# Patient Record
Sex: Male | Born: 1975 | Race: Black or African American | Hispanic: No | Smoking: Current every day smoker
Health system: Southern US, Community
[De-identification: ages and names within clinical notes are randomized; demographics above are authoritative.]

## PROBLEM LIST (undated history)

## (undated) DIAGNOSIS — E7800 Pure hypercholesterolemia, unspecified: Secondary | ICD-10-CM

---

## 2008-03-31 ENCOUNTER — Ambulatory Visit: Payer: Self-pay | Admitting: Internal Medicine

## 2008-04-17 ENCOUNTER — Ambulatory Visit (HOSPITAL_COMMUNITY): Admission: RE | Admit: 2008-04-17 | Discharge: 2008-04-17 | Payer: Self-pay | Admitting: Internal Medicine

## 2008-04-17 ENCOUNTER — Encounter: Payer: Self-pay | Admitting: Internal Medicine

## 2008-04-17 ENCOUNTER — Ambulatory Visit: Payer: Self-pay | Admitting: Internal Medicine

## 2008-05-28 DIAGNOSIS — K5909 Other constipation: Secondary | ICD-10-CM

## 2008-05-28 DIAGNOSIS — K921 Melena: Secondary | ICD-10-CM

## 2008-05-28 DIAGNOSIS — R12 Heartburn: Secondary | ICD-10-CM

## 2008-05-28 DIAGNOSIS — F17200 Nicotine dependence, unspecified, uncomplicated: Secondary | ICD-10-CM | POA: Insufficient documentation

## 2008-05-28 DIAGNOSIS — F1010 Alcohol abuse, uncomplicated: Secondary | ICD-10-CM | POA: Insufficient documentation

## 2008-05-28 DIAGNOSIS — K219 Gastro-esophageal reflux disease without esophagitis: Secondary | ICD-10-CM | POA: Insufficient documentation

## 2008-05-29 ENCOUNTER — Ambulatory Visit: Payer: Self-pay | Admitting: Internal Medicine

## 2010-08-31 NOTE — Consult Note (Signed)
NAME:  Christopher Key, Christopher Key          ACCOUNT NO.:  [id number]   MEDICAL RECORD NO.:  [id number]          PATIENT TYPE:  AMB   LOCATION:  DAY                           FACILITY:  [facility]   PHYSICIAN:  Christopher Key, M.D. DATE OF BIRTH:  09-08-1975   DATE OF CONSULTATION:  03/31/2008  DATE OF DISCHARGE:                                 CONSULTATION   REASON FOR CONSULTATION:  Constipation, needs colonoscopy.   PHYSICIAN REQUESTING CONSULTATION:  Christopher A. Gerda Diss, MD   HISTORY OF PRESENT ILLNESS:  The patient is a very pleasant 35 year old  gentleman who presents today with complaints of chronic constipation.  He sent for colonoscopy.  The patient states he has constipation all of  his life.  He has tried various over-the-counter agents without  significant results.  Most recently, he was started on MiraLax 17 g  nightly.  He states it did not seem to help.  He has had to use Fleet  Enema or suppositories on demand.  He states generally he has a bowel  movement on most days, but he often has to strain quite a bit.  The  stools are hard.  He does have hematochezia at times.  He notes that he  eats plenty of vegetables in his diet, but his fluid intake consists  only of caffeinated products.  He has gained over 30 pounds since he has  been out of work for last 1 year.  He also has frequent heartburn.  He  takes Alka-Seltzer p.Christophern.  Denies any dysphagia, odynophagia, nausea,  vomiting, or melena.   CURRENT MEDICATIONS:  Tylenol p.Christophern., Advil p.Christophern., and Alka-Seltzer  p.Christophern. heartburn.   ALLERGIES:  No known drug allergies.   PAST MEDICAL HISTORY:  1. Chronic GERD.  2. Chronic constipation.   PAST SURGICAL HISTORY:  None.   FAMILY HISTORY:  Maternal grandmother died of colon cancer.  Mother has  a history of IBS.   SOCIAL HISTORY:  He is married with 3 children.  He has been out of work  for over a year, was laid off.  He quit smoking about a year ago.  He  consumes alcohol  rarely.   REVIEW OF SYSTEMS:  See HPI for GI.  CONSTITUTIONAL:  See HPI.  CARDIOPULMONARY:  No chest pain, shortness of breath, palpitations, or  cough.  GENITOURINARY:  No dysuria or hematuria.   PHYSICAL EXAMINATION:  VITAL SIGNS:  Weight 244, height 6 feet 2 inches,  temp 97.3, blood pressure 128/88, and pulse 80.  GENERAL:  Pleasant, well-nourished, well-developed black gentleman in no  acute distress.  SKIN:  Warm and dry.  No jaundice.  HEENT:  Sclerae nonicteric.  Oropharyngeal mucosa moist and pink.  No  lesions, erythema, or exudate.  No lymphadenopathy or thyromegaly.  CHEST:  Lungs are clear to auscultation.  CARDIOVASCULAR:  Regular rate and rhythm.  Normal S1 and S2.  No  murmurs, rubs, or gallops.  ABDOMEN:  Positive bowel sounds.  Abdomen is soft, slightly obese.  He  has mild left mid abdominal tenderness to deep palpation.  No rebound or  guarding.  No organomegaly or masses.  No abdominal bruits or hernia.  LOWER EXTREMITIES:  No edema.   IMPRESSION:  Christopher Key is a 35 year old gentleman with chronic  constipation with intermittent hematochezia, chronic gastroesophageal  reflux disease, and left-sided abdominal pain.  His abdominal pain may  be related to his constipation.  We will recommend colonoscopy to  further evaluate constipation and hematochezia.  Differential includes  benign anorectal source for bleeding, unlikely inflammatory bowel  disease, polyps, malignancy.  He has chronic uncomplicated  gastroesophageal reflux disease symptoms, would recommend proton pump  inhibitor therapy.   PLAN:  1. Colonoscopy with Dr. Jena Key in the near future.  2. Recommend Prilosec OTC one p.o. daily p.Christophern. heartburn.  If he has      frequent heartburn and having symptoms more than 2-3 days a week we      will recommend regular therapy.  We did not have any Prilosec      samples, so we gave him Nexium #20 to get started and he will      switch to Prilosec OTC or  Prevacid OTC for chronic therapy.  3. Further recommendations to follow.  4. I would like to thank Dr. Lilyan Key for allowing [place] to take part      in the care of this patient.      Christopher Key, P.AJonathon Key, M.D.  Electronically Signed    LL/MEDQ  D:  03/31/2008  T:  04/01/2008  Job:  [id number]   cc:   Christopher Picket A. Gerda Diss, MD  Fax: [phone]

## 2010-08-31 NOTE — Assessment & Plan Note (Signed)
NAMEMarland Key  HANIF, RADIN           CHART#:  [id number]   DATE:  05/29/2008                       DOB:  1975/10/23   SUBJECTIVE:  The patient is here for followup visit.  He underwent an  ileocolonoscopy with biopsy and lesion ablation on 04/17/2008.  Procedure was being done for chronic constipation and intermittent  hematochezia in the setting of Alka-Seltzer and Advil use on a regular  basis.  He had granularity, submucosal hemorrhage involving the rectal  mucosa.  The biopsies were nonspecific.  He had sigmoid polyps, which  were hyperplastic.  Terminal ileum was normal up to 10 cm.  He was given  a 2-week course of Canasa suppositories.  He told me that he was not  able to get the prescription refilled initially because he said he lost  the prescription, but he also had concerns about not being able to  afford them.  He did not try the Colace recommended either.  He states  he is feeling a lot better at this point.  He is currently having more  regular bowel movements.  His stools are sometimes still hard.  He has  had no further rectal bleeding.  He states he has not really had any  further left-sided abdominal pain.  He is concerned that if will have  recurrent symptoms as he tells me he does have episodes where he gets  severely constipated at times.  He denies any nausea, vomiting,  heartburn, dysphagia, odynophagia, or weight loss.  He tries to limit  his Advil and Alka-Seltzer use.   PHYSICAL EXAMINATION:  VITAL SIGNS:  Weight 241, temp 98.3, blood  pressure 128/80, and pulse 60.  GENERAL:  A pleasant, well-nourished, well-developed gentleman in no  acute distress.  SKIN:  Warm and dry.  No jaundice.  HEENT:  Sclerae nonicteric.  Oropharyngeal mucosa moist and pink.  ABDOMEN:  Positive bowel sounds.  Abdomen is soft, nontender, and  nondistended.  No organomegaly or masses.  No rebound or guarding.  No  abdominal bruits or hernias.  LOWER EXTREMITIES:  No edema.   IMPRESSION AND PLAN:  The patient is a 35 year old gentleman with  chronic constipation with intermittent hematochezia and recent abdominal  pain.  He is doing much better at this time.  His bowels are more  regular.  He did have some nonspecific proctitis at time of his  colonoscopy and some hyperplastic polyps, which were removed.  Unfortunately, he did not take his suppositories as recommended.  He is  not on stool softener at this time.  He has had no further bleeding.  We  discussed options today.  I suspect he will do fine without any sort of  treatment for his proctitis as long as we can continue to keep his bowel  movements.  Because of cost restraints, I have prescribed him Anusol-HC  Suppositories 1 per rectum b.i.d. for 2 weeks and he will take Colace  200 mg daily, but may decrease the dose if he develops diarrhea.  We  will  have him come back to see [place] on a p.r.n. basis only.  If he has any  further problems, he should let us know.  Otherwise, follow up with Dr.  Gerda Diss as needed.       Tana Coast, P.A.  Electronically Signed     R. Roetta Sessions, M.D.  Electronically Signed    LL/MEDQ  D:  05/29/2008  T:  05/29/2008  Job:  [id number]   cc:   Lorin Picket A. Gerda Diss, MD

## 2010-08-31 NOTE — Op Note (Signed)
NAME:  Christopher Key, Christopher Key          ACCOUNT NO.:  [id number]   MEDICAL RECORD NO.:  [id number]          PATIENT TYPE:  AMB   LOCATION:  DAY                           FACILITY:  [facility]   PHYSICIAN:  R. Roetta Sessions, M.D. DATE OF BIRTH:  Apr 16, 1976   DATE OF PROCEDURE:  04/17/2008  DATE OF DISCHARGE:                               OPERATIVE REPORT   PROCEDURE:  Ileocolonoscopy with biopsy, lesion ablation.   INDICATIONS FOR PROCEDURE:  A 35 year old gentleman with chronic  constipation and intermittent hematochezia, who takes Alka-Seltzer and  Advil on a regular basis.  Colonoscopy is now being done to further  evaluate his symptoms.  This approach has been discussed with the  patient at length.  Potential risks, benefits, alternatives, and  limitations have been reviewed, questions were answered.  He is  agreeable.  Please see the documentation in the medical record.   PROCEDURE NOTE:  O2 saturation, blood pressure, pulse, and respirations  were monitored throughout the entire procedure.   CONSCIOUS SEDATION:  Versed 4 mg IV and Demerol 100 mg IV in divided  doses.   INSTRUMENT:  Pentax video chip system.   FINDINGS:  Digital rectal exam revealed no abnormalities.  Endoscopic  findings:  The prep was adequate.  Colon:  Colonic mucosa was surveyed  from the rectosigmoid junction through the left transverse, right colon,  to the appendiceal orifice, ileocecal valve, and cecum.  These  structures were well seen and photographed for the record.  From this  level, the scope was slowly withdrawn.  All previously mentioned mucosal  surfaces were again seen.  The terminal ileum was intubated to 10 cm.  The patient was noted to have a couple of diminutive polyps in the mid  sigmoid.  They were cold biopsied/removed.  There are multiple other  tiny diminutive polyps at the rectosigmoid, which were ablated with a  tip of hot snare cautery unit.  The remainder of the colonic mucosa and  terminal ileal mucosa appeared normal.  The scope was pulled down the  rectum where thorough examination of the rectal mucosa including the  retroflexed view of the anal verge demonstrated some patchy granularity,  friability, submucosal hemorrhage, more than would be expected with  intermittent tip/scope trauma, suspicious for proctitis.  The rectal  mucosa was biopsied x2.  The patient tolerated the procedure well and  was reacted to Endoscopy.   IMPRESSION:  1. Granularity submucosal hemorrhage involving the rectal mucosa,      suspicious for proctitis status post biopsy, diminutive      rectosigmoid polyp status post hot snare tip ablation.  2. Diminutive sigmoid polyps status post cold biopsy removal.      Remaining colonic mucosa and terminal ileal mucosa appeared normal.   RECOMMENDATIONS:  1. Minimize the use of Advil and Alka-Seltzer for time being.  2. Followup on path.  3. A 2-week course of Canasa, 1 g mesalamine suppositories 1 per      rectum at bedtime.  4. Continue MiraLax p.r.n. constipation.  5. Add Colace 100 mg orally twice daily to his regimen to manage  constipation.  Further recommendations is to follow up in the near      future.      Jonathon Bellows, M.D.  Electronically Signed     RMR/MEDQ  D:  04/17/2008  T:  04/18/2008  Job:  [id number]   cc:   Lorin Picket A. Gerda Diss, MD  Fax: [phone]

## 2013-08-07 ENCOUNTER — Ambulatory Visit (INDEPENDENT_AMBULATORY_CARE_PROVIDER_SITE_OTHER): Payer: BC Managed Care – PPO | Admitting: Family Medicine

## 2013-08-07 VITALS — BP 130/88 | HR 63 | Temp 97.8°F | Ht 68.0 in | Wt 216.0 lb

## 2013-08-07 DIAGNOSIS — Z0000 Encounter for general adult medical examination without abnormal findings: Secondary | ICD-10-CM

## 2013-08-07 DIAGNOSIS — E785 Hyperlipidemia, unspecified: Secondary | ICD-10-CM

## 2013-08-07 LAB — POCT CBC
Granulocyte percent: 58 %G (ref 37–80)
HEMATOCRIT: 49.2 % (ref 43.5–53.7)
HEMOGLOBIN: 16.2 g/dL (ref 14.1–18.1)
LYMPH, POC: 2.7 (ref 0.6–3.4)
MCH: 29.8 pg (ref 27–31.2)
MCHC: 32.9 g/dL (ref 31.8–35.4)
MCV: 90.4 fL (ref 80–97)
MID (cbc): 0.6 (ref 0–0.9)
MPV: 9.4 fL (ref 0–99.8)
POC GRANULOCYTE: 4.5 (ref 2–6.9)
POC LYMPH PERCENT: 34.5 %L (ref 10–50)
POC MID %: 7.5 %M (ref 0–12)
Platelet Count, POC: 208 10*3/uL (ref 142–424)
RBC: 5.44 M/uL (ref 4.69–6.13)
RDW, POC: 15.1 %
WBC: 7.8 10*3/uL (ref 4.6–10.2)

## 2013-08-07 NOTE — Patient Instructions (Signed)
Strongly consider stopping smoking as discussed. Take a target date, then taper off the cigarettes to that date, with a goal of getting down to 6 or 8 cigarettes by you quit date. At that point quit all cigarettes and be miserable but don't pick up another cigarette ever. Plavix to yourself that you will not smoking even if major life crisis arises.  Return in one year or as needed

## 2013-08-07 NOTE — Progress Notes (Signed)
Physical examination:  History: 38 year old man who is here for a physical exam. He has no major acute complaints. He has been healthy.  Past medical history: Medical illnesses: None Surgical history: Unremarkable. He had some kind of a colonoscopy done once under anesthesia to check his prostate. I measure exactly when he had. Medications: None Allergies: None  Family history: Both parents are living and well, in their early 7s. 2 siblings are living and well. He has 3 children who live with him also living and well  Social history: Divorced, raises his 3 children. His parents live in the adjacent to down and help him out some. He does most of the work himself.Marland Kitchen He smokes one pack a day as he is done 15 years. 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 Rarely drinks. Does not use any drugs. High school education. Not sexually involved for 6 years.  Review of systems: Constitutional: Unremarkable HEENT: Unremarkable Eyes: Unremarkable Referred: Unremarkable Cardiovascular: Unremarkable Gastrointestinal: Unremarkable Endocrine: Unremarkable Genitourinary: Unremarkable Musculoskeletal: Unremarkable Allergy: Unremarkable Neurological: Unremarkable Hematologic: Unremarkable Psychiatric: Unremarkable   Physical examination: Well-developed well-nourished man in no acute distress. TMs are normal. Eyes PERRLA. Fundi benign. Throat clear. Neck supple without nodes or thyromegaly. No carotid bruits. Chest is clear to auscultation. Heart regular without murmurs gallops or arrhythmias. Abdomen has normal bowel sounds. Soft without organomegaly masses or tenderness. Normal male external genitalia with testes descended.tremities unremarkable. Skin normal except for some bruises.  Plan: CBC, C. met, lipids Return when necessary Long talk about smoking secession

## 2013-08-08 LAB — COMPREHENSIVE METABOLIC PANEL
ALT: 31 U/L (ref 0–53)
AST: 21 U/L (ref 0–37)
Albumin: 4.3 g/dL (ref 3.5–5.2)
Alkaline Phosphatase: 50 U/L (ref 39–117)
BILIRUBIN TOTAL: 0.9 mg/dL (ref 0.2–1.2)
BUN: 10 mg/dL (ref 6–23)
CO2: 25 meq/L (ref 19–32)
Calcium: 9.1 mg/dL (ref 8.4–10.5)
Chloride: 103 mEq/L (ref 96–112)
Creat: 0.93 mg/dL (ref 0.50–1.35)
GLUCOSE: 86 mg/dL (ref 70–99)
Potassium: 4 mEq/L (ref 3.5–5.3)
SODIUM: 138 meq/L (ref 135–145)
TOTAL PROTEIN: 7.4 g/dL (ref 6.0–8.3)

## 2013-08-08 LAB — LIPID PANEL
CHOLESTEROL: 247 mg/dL — AB (ref 0–200)
HDL: 36 mg/dL — ABNORMAL LOW (ref >39)
LDL Cholesterol: 180 mg/dL — ABNORMAL HIGH (ref 0–99)
TRIGLYCERIDES: 157 mg/dL — AB (ref <150)
Total CHOL/HDL Ratio: 6.9 Ratio
VLDL: 31 mg/dL (ref 0–40)

## 2013-08-14 MED ORDER — PRAVASTATIN SODIUM 20 MG PO TABS: 20.0000 mg | ORAL_TABLET | Freq: Every day | ORAL | Status: DC

## 2013-08-14 NOTE — Addendum Note (Signed)
Addended by: Honor LohGARRISON, Esterlene Atiyeh M on: 08/14/2013 05:03 PM   Modules accepted: Orders

## 2017-12-25 ENCOUNTER — Other Ambulatory Visit: Payer: Self-pay

## 2017-12-25 ENCOUNTER — Emergency Department (HOSPITAL_COMMUNITY)
Admission: EM | Admit: 2017-12-25 | Discharge: 2017-12-26 | Disposition: A | Discharge status: Home / Self Care | Payer: Self-pay | Attending: Emergency Medicine | Admitting: Emergency Medicine

## 2017-12-25 ENCOUNTER — Encounter (HOSPITAL_COMMUNITY): Payer: Self-pay

## 2017-12-25 DIAGNOSIS — Z79899 Other long term (current) drug therapy: Secondary | ICD-10-CM | POA: Insufficient documentation

## 2017-12-25 DIAGNOSIS — F17210 Nicotine dependence, cigarettes, uncomplicated: Secondary | ICD-10-CM | POA: Insufficient documentation

## 2017-12-25 DIAGNOSIS — R42 Dizziness and giddiness: Secondary | ICD-10-CM | POA: Insufficient documentation

## 2017-12-25 DIAGNOSIS — M79605 Pain in left leg: Secondary | ICD-10-CM | POA: Insufficient documentation

## 2017-12-25 HISTORY — DX: Pure hypercholesterolemia, unspecified: E78.00

## 2017-12-25 NOTE — ED Triage Notes (Signed)
Pt presents to ED for left leg pain and dizziness. Pt states his leg pain started this morning from lower back down to his foot. Pt denies any injury to his leg. Pt states he started getting dizzy this evening while at work. Pt denies SOB and chest pain.

## 2017-12-26 NOTE — Discharge Instructions (Addendum)
Your blood pressure is slightly elevated at 158/96.  Please have this rechecked soon.  The remainder of your vital signs are within normal limits.  Please use your pillow or donut when sitting over the next 3 days.  After the 3-day period, use this donut or pillow on an as-needed basis.  May use Tylenol every 4 hours or ibuprofen every 6 hours for pain or soreness.  Please see your primary physician or return to the emergency department if this leg pain continues.  You have reported that the dizziness has resolved after you had a Goody powder earlier during the day.  Please increase your fluids.  Please change positions slowly.  See your physician or return to the emergency department if the dizziness returns.

## 2017-12-26 NOTE — ED Provider Notes (Signed)
[facility] EMERGENCY DEPARTMENT Provider Note   CSN: [id number] Arrival date & time: 12/25/17  2240     History   Chief Complaint Chief Complaint  Patient presents with  . Leg Pain  . Dizziness    HPI Christopher Key is a 42 y.o. male.  Patient is a 42 year old male who presents to the emergency department with complaint of left leg pain and dizziness.  The patient states that the left leg pain started around 2:40 PM today.  Patient states he awoke and noted that he had a cramp type sensation in his left lower leg.  This was accompanied by a feeling of a heavy weight and also a sharp shooting pain going from the hip area down to the foot area.  The patient says he has not had any recent injury to his lower leg.  He has not had any recent operations procedures involving his leg or his back. Pt denies any excess swelling of the lower extremities. No long trips.   He has not had any problems with his back.  He states that he has had some increase activity at his job having to do more getting on and off of equipment and doing more walking and bending.  The patient also states that he does a lot of gaming.  He says that he has a gaming chair.  And that sometimes he is quite sore when he gets out of this hard chair from gaming for such long periods of time.  The patient describes his dizziness as a sensation of being off balance.  It started around 8 PM this afternoon known.  Said he felt at times like he might faint.  When he was on certain pieces of the equipment at his job he says he just did not feel steady or stable.  He went to sit down for little while.  He took BC powder.  And now he says that the dizziness has resolved.  During the time of the dizziness he did not have any double vision.  There was no palpitations.  No unusual headache to be reported.  The patient presents at this time for assistance with these issues.  The history is provided by the patient.  Leg Pain     Dizziness  Associated symptoms: no blood in stool, no chest pain, no palpitations and no shortness of breath     Past Medical History:  Diagnosis Date  . Hypercholesteremia     Patient Active Problem List   Diagnosis Date Noted  . ALCOHOL USE 05/28/2008  . SMOKER 05/28/2008  . GERD 05/28/2008  . CONSTIPATION, CHRONIC 05/28/2008  . HEMATOCHEZIA 05/28/2008  . HEARTBURN 05/28/2008    History reviewed. No pertinent surgical history.      Home Medications    Prior to Admission medications   Medication Sig Start Date End Date Taking? Authorizing Provider  pravastatin (PRAVACHOL) 20 MG tablet Take 1 tablet (20 mg total) by mouth daily. 08/14/13   Peyton Najjar, MD    Family History No family history on file.  Social History Social History   Tobacco Use  . Smoking status: Current Every Day Smoker    Packs/day: 1.00    Types: Cigarettes  . Smokeless tobacco: Never Used  Substance Use Topics  . Alcohol use: Yes    Alcohol/week: 2.0 standard drinks    Types: 1 Cans of beer, 1 Glasses of wine per week  . Drug use: Not on file  Allergies   Patient has no allergy information on record.   Review of Systems Review of Systems  Constitutional: Negative for activity change.       All ROS Neg except as noted in HPI  HENT: Negative for nosebleeds.   Eyes: Negative for photophobia and discharge.  Respiratory: Negative for cough, shortness of breath and wheezing.   Cardiovascular: Negative for chest pain and palpitations.  Gastrointestinal: Negative for abdominal pain and blood in stool.  Genitourinary: Negative for dysuria, frequency and hematuria.  Musculoskeletal: Negative for arthralgias, back pain and neck pain.       Leg pain  Skin: Negative.   Neurological: Positive for dizziness. Negative for seizures and speech difficulty.  Psychiatric/Behavioral: Negative for confusion and hallucinations.     Physical Exam Updated Vital Signs BP (!) 158/96 (BP  Location: Right Arm)   Pulse 87   Temp 98.2 F (36.8 C) (Oral)   Resp 20   Ht 6\' 1"  (1.854 m)   Wt 104.3 kg   SpO2 99%   BMI 30.34 kg/m   Physical Exam  Constitutional: He appears well-developed and well-nourished. No distress.  HENT:  Head: Normocephalic and atraumatic.  Right Ear: External ear normal.  Left Ear: External ear normal.  Eyes: Conjunctivae are normal. Right eye exhibits no discharge. Left eye exhibits no discharge. No scleral icterus.  Neck: Neck supple. No tracheal deviation present.  Cardiovascular: Normal rate, regular rhythm and intact distal pulses.  Pulmonary/Chest: Effort normal and breath sounds normal. No stridor. No respiratory distress. He has no wheezes. He has no rales.  Abdominal: Soft. Bowel sounds are normal. He exhibits no distension. There is no tenderness. There is no rebound and no guarding.  Musculoskeletal: Normal range of motion. He exhibits no edema or tenderness.       Lumbar back: Normal.       Left upper leg: Normal.  No swelling of the left lower extremity.  Negative Homans sign.  Neurological: He is alert. He has normal strength. No cranial nerve deficit (no facial droop, extraocular movements intact, no slurred speech) or sensory deficit. He exhibits normal muscle tone. He displays no seizure activity. Coordination normal.  Skin: Skin is warm and dry. No rash noted.  Psychiatric: He has a normal mood and affect.  Nursing note and vitals reviewed.    ED Treatments / Results  Labs (all labs ordered are listed, but only abnormal results are displayed) Labs Reviewed - No data to display  EKG EKG Interpretation  Date/Time:  Monday December 25 2017 22:57:31 EDT Ventricular Rate:  90 PR Interval:  136 QRS Duration: 96 QT Interval:  356 QTC Calculation: 435 R Axis:   5 Text Interpretation:  Normal sinus rhythm with sinus arrhythmia Moderate voltage criteria for LVH, may be normal variant Nonspecific T wave abnormality No old  tracing to compare Confirmed by Devoria Albe ([id number]) on 12/25/2017 11:04:13 PM   Radiology No results found.  Procedures Procedures (including critical care time)  Medications Ordered in ED Medications - No data to display   Initial Impression / Assessment and Plan / ED Course  I have reviewed the triage vital signs and the nursing notes.  Pertinent labs & imaging results that were available during my care of the patient were reviewed by me and considered in my medical decision making (see chart for details).       Final Clinical Impressions(s) / ED Diagnoses MDM  Blood pressure slightly elevated at 158/96, otherwise vital signs are within  normal limits.  Patient reports pain of the left leg.  He states however that he spends large amounts of time on a special gaming chair and sometimes when he gets off of it he is sore.  He also reports that he has been doing more activity at his job recently.  The pain has improved significantly after a BC powder.  Doubt the patient has a deep vein thrombus, as he is low risk by Wells criteria.  Also patient has not been in any excessively long trips, has not had any surgery or cancer recently.  Doubt deep tissue injury, as the patient has not had trauma or injury to this area.  I have asked the patient to purchase a pillow and to use a pillow on his gaming chair and also use a pillow when he is at rest to take some of the pressure off of his hip.  I suspect that he may have a contusion where the nerve plexus within the hip area.  The patient complained of some "dizziness" which he described as a sensation of being off balance and that he might faint.  This was not accompanied by any unusual headache.  No palpitations.  No chest pain.  No sweats.  It seemed to improve when he went to rest, and he states it was better after he took the [facility] powder.  His mother reports that he does not drink much water, but usually energy drinks and sodas.  Patient also was  noted to have some mild to moderate nasal congestion.  I have asked the patient to monitor the dizziness.  And to see his primary physician or return to the emergency department if this problem continues.  We did discuss increasing water in his system.  Patient is in agreement with this plan.   Final diagnoses:  Left leg pain  Dizziness    ED Discharge Orders    None       Ivery Quale, PA-C 12/26/17 0105    Devoria Albe, MD 12/26/17 [phone]

## 2019-04-06 ENCOUNTER — Emergency Department (HOSPITAL_COMMUNITY)
Admission: EM | Admit: 2019-04-06 | Discharge: 2019-04-06 | Disposition: A | Discharge status: Home / Self Care | Payer: Self-pay | Attending: Emergency Medicine | Admitting: Emergency Medicine

## 2019-04-06 ENCOUNTER — Encounter (HOSPITAL_COMMUNITY): Payer: Self-pay

## 2019-04-06 ENCOUNTER — Other Ambulatory Visit: Payer: Self-pay

## 2019-04-06 DIAGNOSIS — Y929 Unspecified place or not applicable: Secondary | ICD-10-CM | POA: Insufficient documentation

## 2019-04-06 DIAGNOSIS — Y999 Unspecified external cause status: Secondary | ICD-10-CM | POA: Insufficient documentation

## 2019-04-06 DIAGNOSIS — T161XXA Foreign body in right ear, initial encounter: Secondary | ICD-10-CM | POA: Insufficient documentation

## 2019-04-06 DIAGNOSIS — F17210 Nicotine dependence, cigarettes, uncomplicated: Secondary | ICD-10-CM | POA: Insufficient documentation

## 2019-04-06 DIAGNOSIS — Y939 Activity, unspecified: Secondary | ICD-10-CM | POA: Insufficient documentation

## 2019-04-06 DIAGNOSIS — X58XXXA Exposure to other specified factors, initial encounter: Secondary | ICD-10-CM | POA: Insufficient documentation

## 2019-04-06 MED ORDER — LIDOCAINE HCL (PF) 1 % IJ SOLN
5.0000 mL | Freq: Once | INTRAMUSCULAR | Status: AC
  Administered 2019-04-06: 20:00:00 5 mL
  Filled 2019-04-06: qty 6

## 2019-04-06 MED ORDER — ACETAMINOPHEN 500 MG PO TABS
1000.0000 mg | ORAL_TABLET | Freq: Once | ORAL | Status: AC
  Administered 2019-04-06: 21:00:00 1000 mg via ORAL
  Filled 2019-04-06: qty 2

## 2019-04-06 NOTE — ED Provider Notes (Signed)
[facility] EMERGENCY DEPARTMENT Provider Note   CSN: [id number] Arrival date & time: 04/06/19  1936     History Chief Complaint  Patient presents with  . Foreign Body in [place] is a 43 y.o. male.  Chief complaint foreign body in right ear just prior to ED visit while patient was taking a nap.  No other issues.        Past Medical History:  Diagnosis Date  . Hypercholesteremia     Patient Active Problem List   Diagnosis Date Noted  . ALCOHOL USE 05/28/2008  . SMOKER 05/28/2008  . GERD 05/28/2008  . CONSTIPATION, CHRONIC 05/28/2008  . HEMATOCHEZIA 05/28/2008  . HEARTBURN 05/28/2008    History reviewed. No pertinent surgical history.     No family history on file.  Social History   Tobacco Use  . Smoking status: Current Every Day Smoker    Packs/day: 1.00    Types: Cigarettes  . Smokeless tobacco: Never Used  Substance Use Topics  . Alcohol use: Yes    Alcohol/week: 2.0 standard drinks    Types: 1 Cans of beer, 1 Glasses of wine per week  . Drug use: Not on file    Home Medications Prior to Admission medications   Medication Sig Start Date End Date Taking? Authorizing Provider  pravastatin (PRAVACHOL) 20 MG tablet Take 1 tablet (20 mg total) by mouth daily. 08/14/13   Posey Boyer, MD    Allergies    Patient has no known allergies.  Review of Systems   Review of Systems  All other systems reviewed and are negative.   Physical Exam Updated Vital Signs BP (!) 141/102 (BP Location: Right Arm) Comment: pt appears anxious in triage  Pulse 61   Temp 97.8 F (36.6 C) (Temporal)   Resp 17   Ht 6\' 2"  (1.88 m)   Wt 95.3 kg   SpO2 96%   BMI 26.96 kg/m   Physical Exam Vitals and nursing note reviewed.  Constitutional:      Appearance: He is well-developed.  HENT:     Head: Normocephalic and atraumatic.     Comments: Right ear: Foreign body deep into external canal.  Appears to be a roach. Eyes:   Conjunctiva/sclera: Conjunctivae normal.  Cardiovascular:     Rate and Rhythm: Normal rate and regular rhythm.  Pulmonary:     Effort: Pulmonary effort is normal.     Breath sounds: Normal breath sounds.  Abdominal:     General: Bowel sounds are normal.     Palpations: Abdomen is soft.  Musculoskeletal:        General: Normal range of motion.     Cervical back: Neck supple.  Skin:    General: Skin is warm and dry.  Neurological:     General: No focal deficit present.     Mental Status: He is alert and oriented to person, place, and time.  Psychiatric:        Behavior: Behavior normal.     ED Results / Procedures / Treatments   Labs (all labs ordered are listed, but only abnormal results are displayed) Labs Reviewed - No data to display  EKG None  Radiology No results found.  Procedures .Foreign Body Removal  Date/Time: 04/06/2019 9:14 PM Performed by: Nat Christen, MD Authorized by: Nat Christen, MD  Consent: Verbal consent obtained. Risks and benefits: risks, benefits and alternatives were discussed Consent given by: patient Patient understanding: patient states understanding of the  procedure being performed Patient identity confirmed: verbally with patient Body area: ear Location details: right ear Anesthesia: local infiltration  Anesthesia: Local Anesthetic: lidocaine 1% without epinephrine  Sedation: Patient sedated: no  Localization method: ENT speculum Removal mechanism: alligator forceps Complexity: complex Post-procedure assessment: foreign body removed Comments: 2.0 cm roach removed from deep into external canal.   (including critical care time)  Medications Ordered in ED Medications  lidocaine (PF) (XYLOCAINE) 1 % injection 5 mL (5 mLs Infiltration Given 04/06/19 2023)  acetaminophen (TYLENOL) tablet 1,000 mg (1,000 mg Oral Given 04/06/19 2058)    ED Course  I have reviewed the triage vital signs and the nursing notes.  Pertinent labs &  imaging results that were available during my care of the patient were reviewed by me and considered in my medical decision making (see chart for details).    MDM Rules/Calculators/A&P                      Successful removal of roach from right ear external canal. Final Clinical Impression(s) / ED Diagnoses Final diagnoses:  Foreign body of right ear, initial encounter    Rx / DC Orders ED Discharge Orders    None       Donnetta Hutching, MD 04/06/19 2116

## 2019-04-06 NOTE — ED Notes (Signed)
Lidocaine instilled into R ear

## 2019-04-06 NOTE — ED Notes (Signed)
Visible FB to R ear

## 2019-04-06 NOTE — ED Triage Notes (Signed)
Pt awoke today and felt "something crawling in his ear". Pt attempted to remove with several different things, including q-tip, unable to removed. (right ear canal is red, unable to visualize TM, unsure what is blocking TM)

## 2019-04-06 NOTE — Discharge Instructions (Addendum)
Go to [facility] and get some drops for earwax.  This will help keep your ear drained.  Tylenol for discomfort.

## 2019-04-27 ENCOUNTER — Other Ambulatory Visit: Payer: Self-pay

## 2019-04-27 ENCOUNTER — Encounter (HOSPITAL_COMMUNITY): Payer: Self-pay | Admitting: *Deleted

## 2019-04-27 ENCOUNTER — Emergency Department (HOSPITAL_COMMUNITY)
Admission: EM | Admit: 2019-04-27 | Discharge: 2019-04-27 | Disposition: A | Discharge status: Home / Self Care | Payer: Self-pay | Attending: Emergency Medicine | Admitting: Emergency Medicine

## 2019-04-27 DIAGNOSIS — F17210 Nicotine dependence, cigarettes, uncomplicated: Secondary | ICD-10-CM | POA: Insufficient documentation

## 2019-04-27 DIAGNOSIS — H6091 Unspecified otitis externa, right ear: Secondary | ICD-10-CM | POA: Insufficient documentation

## 2019-04-27 DIAGNOSIS — Z79899 Other long term (current) drug therapy: Secondary | ICD-10-CM | POA: Insufficient documentation

## 2019-04-27 MED ORDER — NEOMYCIN-POLYMYXIN-HC 3.5-10000-1 OT SOLN: 4.0000 [drp] | Freq: Four times a day (QID) | OTIC | 0 refills | Status: AC

## 2019-04-27 NOTE — ED Notes (Signed)
Pt uses Qtips in ears   Reports doesn't know if he has a bug in his R ear or if something is in there   Here for evaluation

## 2019-04-27 NOTE — ED Triage Notes (Signed)
Pt with right ear pain for a day now.  Pt wants it checked for any FB. Hx of bug in the past

## 2019-04-27 NOTE — ED Provider Notes (Signed)
[facility] EMERGENCY DEPARTMENT Provider Note   CSN: [id number] Arrival date & time: 04/27/19  1304     History Chief Complaint  Patient presents with  . Otalgia    Christopher Key is a 44 y.o. male.  The history is provided by the patient. No language interpreter was used.  Otalgia Location:  Right Behind ear:  No abnormality Quality:  Aching Severity:  Moderate Onset quality:  Gradual Timing:  Constant Progression:  Worsening Chronicity:  New Relieved by:  Nothing Worsened by:  Nothing Ineffective treatments:  None tried Associated symptoms: no fever and no rhinorrhea   Pt reports he had a bug in his ear several months ago.  Pt worried he has something in his ear now.     Past Medical History:  Diagnosis Date  . Hypercholesteremia     Patient Active Problem List   Diagnosis Date Noted  . ALCOHOL USE 05/28/2008  . SMOKER 05/28/2008  . GERD 05/28/2008  . CONSTIPATION, CHRONIC 05/28/2008  . HEMATOCHEZIA 05/28/2008  . HEARTBURN 05/28/2008    History reviewed. No pertinent surgical history.     History reviewed. No pertinent family history.  Social History   Tobacco Use  . Smoking status: Current Every Day Smoker    Packs/day: 1.00    Types: Cigarettes  . Smokeless tobacco: Never Used  Substance Use Topics  . Alcohol use: Yes    Alcohol/week: 2.0 standard drinks    Types: 1 Cans of beer, 1 Glasses of wine per week  . Drug use: Not on file    Home Medications Prior to Admission medications   Medication Sig Start Date End Date Taking? Authorizing Provider  neomycin-polymyxin-hydrocortisone (CORTISPORIN) OTIC solution Place 4 drops into the right ear 4 (four) times daily for 10 days. 04/27/19 05/07/19  Fransico Meadow, PA-C  pravastatin (PRAVACHOL) 20 MG tablet Take 1 tablet (20 mg total) by mouth daily. 08/14/13   Posey Boyer, MD    Allergies    Patient has no known allergies.  Review of Systems   Review of Systems  Constitutional:  Negative for fever.  HENT: Positive for ear pain. Negative for rhinorrhea.   All other systems reviewed and are negative.   Physical Exam Updated Vital Signs BP 129/62 (BP Location: Right Arm)   Pulse 90   Temp 98.5 F (36.9 C) (Oral)   Resp 20   Ht 6\' 1"  (1.854 m)   Wt 95.3 kg   SpO2 98%   BMI 27.71 kg/m   Physical Exam Vitals and nursing note reviewed.  Constitutional:      Appearance: He is well-developed.  HENT:     Head: Normocephalic and atraumatic.     Left Ear: Tympanic membrane normal.     Ears:     Comments: Wax right canal. Canal erythematous,  Eyes:     Conjunctiva/sclera: Conjunctivae normal.  Cardiovascular:     Rate and Rhythm: Normal rate and regular rhythm.     Heart sounds: No murmur.  Pulmonary:     Effort: Pulmonary effort is normal. No respiratory distress.     Breath sounds: Normal breath sounds.  Abdominal:     Palpations: Abdomen is soft.     Tenderness: There is no abdominal tenderness.  Musculoskeletal:     Cervical back: Neck supple.  Skin:    General: Skin is warm and dry.  Neurological:     General: No focal deficit present.     Mental Status: He is alert.  Psychiatric:        Mood and Affect: Mood normal.     ED Results / Procedures / Treatments   Labs (all labs ordered are listed, but only abnormal results are displayed) Labs Reviewed - No data to display  EKG None  Radiology No results found.  Procedures .Ear Cerumen Removal  Date/Time: 04/27/2019 3:07 PM Performed by: Elson Areas, PA-C Authorized by: Elson Areas, PA-C   Consent:    Consent obtained:  Verbal   Consent given by:  Patient   Alternatives discussed:  No treatment Procedure details:    Location:  R ear   Procedure type: irrigation   Post-procedure details:    Patient tolerance of procedure:  Tolerated well, no immediate complications   (including critical care time)  Medications Ordered in ED Medications - No data to display  ED Course   I have reviewed the triage vital signs and the nursing notes.  Pertinent labs & imaging results that were available during my care of the patient were reviewed by me and considered in my medical decision making (see chart for details).    MDM Rules/Calculators/A&P                      MDM:  Final Clinical Impression(s) / ED Diagnoses Final diagnoses:  Otitis externa of right ear, unspecified chronicity, unspecified type    Rx / DC Orders ED Discharge Orders         Ordered    neomycin-polymyxin-hydrocortisone (CORTISPORIN) OTIC solution  4 times daily     04/27/19 1502        An After Visit Summary was printed and given to the patient.    Elson Areas, [place] 04/27/19 1507    Vanetta Mulders, MD 05/10/19 1520

## 2019-05-28 ENCOUNTER — Ambulatory Visit: Payer: Self-pay | Attending: Internal Medicine

## 2019-05-28 ENCOUNTER — Other Ambulatory Visit: Payer: Self-pay

## 2019-05-28 DIAGNOSIS — Z20822 Contact with and (suspected) exposure to covid-19: Secondary | ICD-10-CM

## 2019-05-29 LAB — NOVEL CORONAVIRUS, NAA: SARS-CoV-2, NAA: NOT DETECTED

## 2019-06-08 ENCOUNTER — Other Ambulatory Visit: Payer: Self-pay

## 2019-06-08 ENCOUNTER — Emergency Department (HOSPITAL_COMMUNITY)
Admission: EM | Admit: 2019-06-08 | Discharge: 2019-06-08 | Disposition: A | Discharge status: Home / Self Care | Payer: Self-pay | Attending: Emergency Medicine | Admitting: Emergency Medicine

## 2019-06-08 ENCOUNTER — Encounter (HOSPITAL_COMMUNITY): Payer: Self-pay | Admitting: Emergency Medicine

## 2019-06-08 DIAGNOSIS — Z79899 Other long term (current) drug therapy: Secondary | ICD-10-CM | POA: Insufficient documentation

## 2019-06-08 DIAGNOSIS — F17210 Nicotine dependence, cigarettes, uncomplicated: Secondary | ICD-10-CM | POA: Insufficient documentation

## 2019-06-08 DIAGNOSIS — K0889 Other specified disorders of teeth and supporting structures: Secondary | ICD-10-CM | POA: Insufficient documentation

## 2019-06-08 MED ORDER — DICLOFENAC SODIUM 50 MG PO TBEC: 50.0000 mg | DELAYED_RELEASE_TABLET | Freq: Two times a day (BID) | ORAL | 0 refills | Status: DC

## 2019-06-08 MED ORDER — AMOXICILLIN 500 MG PO CAPS: 500.0000 mg | ORAL_CAPSULE | Freq: Three times a day (TID) | ORAL | 0 refills | Status: DC

## 2019-06-08 NOTE — ED Notes (Signed)
ED Provider at bedside. 

## 2019-06-08 NOTE — ED Triage Notes (Signed)
Patient c/o right lower dental pain x3 days. Denies any fevers, abscess, or broken tooth that he is aware of. Patient taking BC powder, last dose prior to coming to ED with no relief

## 2019-06-08 NOTE — ED Provider Notes (Signed)
[facility] EMERGENCY DEPARTMENT Provider Note   CSN: [id number] Arrival date & time: 06/08/19  1141     History Chief Complaint  Patient presents with  . Dental Pain    Christopher Key is a 44 y.o. male.  The history is provided by the patient. No language interpreter was used.  Dental Pain Location:  Upper Upper teeth location:  1/RU 3rd molar Quality:  Aching Onset quality:  Gradual Timing:  Constant Progression:  Worsening Chronicity:  New Context: dental caries   Relieved by:  Nothing Worsened by:  Nothing Ineffective treatments:  None tried Associated symptoms: no fever        Past Medical History:  Diagnosis Date  . Hypercholesteremia     Patient Active Problem List   Diagnosis Date Noted  . ALCOHOL USE 05/28/2008  . SMOKER 05/28/2008  . GERD 05/28/2008  . CONSTIPATION, CHRONIC 05/28/2008  . HEMATOCHEZIA 05/28/2008  . HEARTBURN 05/28/2008    History reviewed. No pertinent surgical history.     No family history on file.  Social History   Tobacco Use  . Smoking status: Current Every Day Smoker    Packs/day: 1.00    Types: Cigarettes  . Smokeless tobacco: Never Used  Substance Use Topics  . Alcohol use: Not Currently  . Drug use: Never    Home Medications Prior to Admission medications   Medication Sig Start Date End Date Taking? Authorizing Provider  amoxicillin (AMOXIL) 500 MG capsule Take 1 capsule (500 mg total) by mouth 3 (three) times daily. 06/08/19   Fransico Meadow, PA-C  diclofenac (VOLTAREN) 50 MG EC tablet Take 1 tablet (50 mg total) by mouth 2 (two) times daily. 06/08/19   Fransico Meadow, PA-C  pravastatin (PRAVACHOL) 20 MG tablet Take 1 tablet (20 mg total) by mouth daily. 08/14/13   Posey Boyer, MD    Allergies    Patient has no known allergies.  Review of Systems   Review of Systems  Constitutional: Negative for fever.  All other systems reviewed and are negative.   Physical Exam Updated Vital Signs BP  (!) 134/94 (BP Location: Right Arm)   Pulse 63   Temp 98.4 F (36.9 C) (Oral)   Resp 16   Ht 6' (1.829 m)   Wt 95.3 kg   SpO2 100%   BMI 28.48 kg/m   Physical Exam Vitals and nursing note reviewed.  Constitutional:      Appearance: He is well-developed.  HENT:     Head: Normocephalic and atraumatic.     Nose: Nose normal.     Mouth/Throat:     Mouth: Mucous membranes are moist.  Eyes:     Conjunctiva/sclera: Conjunctivae normal.  Cardiovascular:     Rate and Rhythm: Normal rate and regular rhythm.     Heart sounds: No murmur.  Pulmonary:     Effort: Pulmonary effort is normal. No respiratory distress.     Breath sounds: Normal breath sounds.  Abdominal:     Palpations: Abdomen is soft.     Tenderness: There is no abdominal tenderness.  Musculoskeletal:        General: Normal range of motion.     Cervical back: Neck supple.  Skin:    General: Skin is warm and dry.  Neurological:     General: No focal deficit present.     Mental Status: He is alert.  Psychiatric:        Mood and Affect: Mood normal.  ED Results / Procedures / Treatments   Labs (all labs ordered are listed, but only abnormal results are displayed) Labs Reviewed - No data to display  EKG None  Radiology No results found.  Procedures Procedures (including critical care time)  Medications Ordered in ED Medications - No data to display  ED Course  I have reviewed the triage vital signs and the nursing notes.  Pertinent labs & imaging results that were available during my care of the patient were reviewed by me and considered in my medical decision making (see chart for details).    MDM Rules/Calculators/A&P                      MDM: Pt advised he needs to see a dentist for evaluation  Final Clinical Impression(s) / ED Diagnoses Final diagnoses:  Toothache    Rx / DC Orders ED Discharge Orders         Ordered    amoxicillin (AMOXIL) 500 MG capsule  3 times daily     06/08/19  1233    diclofenac (VOLTAREN) 50 MG EC tablet  2 times daily     06/08/19 1233        An After Visit Summary was printed and given to the patient.    Elson Areas, Cordelia Poche 06/08/19 1235    Bethann Berkshire, MD 06/09/19 [phone]

## 2019-07-01 ENCOUNTER — Other Ambulatory Visit: Payer: Self-pay

## 2019-07-01 ENCOUNTER — Emergency Department (HOSPITAL_COMMUNITY)
Admission: EM | Admit: 2019-07-01 | Discharge: 2019-07-01 | Disposition: A | Discharge status: Home / Self Care | Payer: Self-pay | Attending: Emergency Medicine | Admitting: Emergency Medicine

## 2019-07-01 ENCOUNTER — Emergency Department (HOSPITAL_COMMUNITY): Payer: Self-pay

## 2019-07-01 ENCOUNTER — Encounter (HOSPITAL_COMMUNITY): Payer: Self-pay

## 2019-07-01 DIAGNOSIS — R079 Chest pain, unspecified: Secondary | ICD-10-CM

## 2019-07-01 DIAGNOSIS — F17210 Nicotine dependence, cigarettes, uncomplicated: Secondary | ICD-10-CM | POA: Insufficient documentation

## 2019-07-01 DIAGNOSIS — R0789 Other chest pain: Secondary | ICD-10-CM | POA: Insufficient documentation

## 2019-07-01 LAB — BASIC METABOLIC PANEL
Anion gap: 7 (ref 5–15)
BUN: 11 mg/dL (ref 6–20)
CO2: 26 mmol/L (ref 22–32)
Calcium: 9 mg/dL (ref 8.9–10.3)
Chloride: 106 mmol/L (ref 98–111)
Creatinine, Ser: 1.08 mg/dL (ref 0.61–1.24)
GFR calc Af Amer: >60 mL/min (ref >60)
GFR calc non Af Amer: >60 mL/min (ref >60)
Glucose, Bld: 99 mg/dL (ref 70–99)
Potassium: 3.7 mmol/L (ref 3.5–5.1)
Sodium: 139 mmol/L (ref 135–145)

## 2019-07-01 LAB — CBC
HCT: 48.9 % (ref 39.0–52.0)
Hemoglobin: 16.2 g/dL (ref 13.0–17.0)
MCH: 29.7 pg (ref 26.0–34.0)
MCHC: 33.1 g/dL (ref 30.0–36.0)
MCV: 89.7 fL (ref 80.0–100.0)
Platelets: 223 10*3/uL (ref 150–400)
RBC: 5.45 MIL/uL (ref 4.22–5.81)
RDW: 13.6 % (ref 11.5–15.5)
WBC: 6.7 10*3/uL (ref 4.0–10.5)
nRBC: 0 % (ref 0.0–0.2)

## 2019-07-01 LAB — TROPONIN I (HIGH SENSITIVITY)
Troponin I (High Sensitivity): <2 ng/L (ref <18)
Troponin I (High Sensitivity): <2 ng/L (ref <18)

## 2019-07-01 NOTE — Discharge Instructions (Signed)
Your testing is normal, there is no signs of heart attack, please seek medical exam for severe or worsening symptoms, you may take Tylenol or ibuprofen for pain, follow-up with the family doctor to establish care.  You will likely need to have your blood pressure rechecked within a couple of weeks as it was slightly elevated today.  If you do not have a doctor see the below list  [facility] Doctor List    Kari Baars MD. Specialty: Pulmonary Disease Contact information: [address]  [address]  [place] [address]  [phone]   Syliva Overman, MD. Specialty: [facility] Medicine Contact information: [address]  [place] [address]  [phone]   Lilyan Punt, MD. Specialty: [facility] Medicine Contact information: [address]  [address]  [place] [address]  [phone]   Avon Gully, MD Specialty: Internal Medicine Contact information: [address] [place] [address]  [phone]   Catalina Pizza, MD. Specialty: Internal Medicine Contact information: [address]  [place] [address]  [phone]    [facility] (Dr. Selena Batten) Specialty: Family Medicine Contact information: [address]  [place] [address]  [phone]   John Giovanni, MD. Specialty: [facility] Medicine Contact information: [address]  [address]  [place] [address]  [phone]   Carylon Perches, MD. Specialty: Internal Medicine Contact information: [address]  [address]  [place] [address]  [phone]    [facility]  [address] [place], [place] [address] [phone]  Services [facility] offers a variety of basic health services.  Services include but are not limited to: Blood pressure checks  Heart rate checks  Blood sugar checks  Urine analysis  Rapid strep tests  Pregnancy tests.  Health education and referrals  People  needing more complex services will be directed to a physician online. Using these virtual visits, doctors can evaluate and prescribe medicine and treatments. There will be no medication on-site, though [facility] will help patients fill their prescriptions at little to no cost.   For More information please go to: [email or web address]

## 2019-07-01 NOTE — ED Provider Notes (Signed)
[facility] EMERGENCY DEPARTMENT Provider Note   CSN: [id number] Arrival date & time: 07/01/19  1337     History Chief Complaint  Patient presents with  . Chest Pain    Christopher Key is a 44 y.o. male.  HPI   This patient is a 44 year old male, he has a history of tobacco use, he denies having high cholesterol or hypertension and states he does not take any medications at all.  He reports that he no longer uses any alcohol.  The patient reports that over the last couple of hours he has had a intermittent right-sided chest pain that seems to come and go.  It seems to be worse when he moves or tries to lay on his right side or when he moves his right arm, it is not exertional otherwise and is not associated with shortness of breath fevers chills nausea vomiting or swelling of the legs.  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  He reports having significant anxiety recently and dealing with his 17 year old child who he wants to stay with him more but splits custody with mother.  He is currently having arguments with both his child's mother as well as his own mother and how to deal with this.  He has also been stressed because of the neighbors and occluding his driveway.  He appears mildly anxious and agitated during this discussion.  He denies any prior cardiac history  Past Medical History:  Diagnosis Date  . Hypercholesteremia     Patient Active Problem List   Diagnosis Date Noted  . ALCOHOL USE 05/28/2008  . SMOKER 05/28/2008  . GERD 05/28/2008  . CONSTIPATION, CHRONIC 05/28/2008  . HEMATOCHEZIA 05/28/2008  . HEARTBURN 05/28/2008    History reviewed. No pertinent surgical history.     No family history on file.  Social History   Tobacco Use  . Smoking status: Current Every Day Smoker    Packs/day: 1.00    Types: Cigarettes  . Smokeless tobacco: Never Used  Substance Use  Topics  . Alcohol use: Not Currently  . Drug use: Never    Home Medications Prior to Admission medications   Not on File    Allergies    Patient has no known allergies.  Review of Systems   Review of Systems  All other systems reviewed and are negative.   Physical Exam Updated Vital Signs BP (!) 142/86 (BP Location: Left Arm)   Pulse (!) 59   Temp 97.8 F (36.6 C) (Oral)   Resp 12   Ht 1.854 m (6\' 1" )   Wt 95.3 kg   SpO2 100%   BMI 27.71 kg/m   Physical Exam Vitals and nursing note reviewed.  Constitutional:      General: He is not in acute distress.    Appearance: He is well-developed.  HENT:     Head: Normocephalic and atraumatic.     Mouth/Throat:     Pharynx: No oropharyngeal exudate.  Eyes:     General: No scleral icterus.       Right eye: No discharge.        Left eye: No discharge.     Conjunctiva/sclera: Conjunctivae normal.     Pupils: Pupils are equal, round, and reactive to light.  Neck:     Thyroid: No thyromegaly.     Vascular: No JVD.  Cardiovascular:     Rate and Rhythm: Normal  rate and regular rhythm.     Heart sounds: Normal heart sounds. No murmur. No friction rub. No gallop.   Pulmonary:     Effort: Pulmonary effort is normal. No respiratory distress.     Breath sounds: Normal breath sounds. No wheezing or rales.  Abdominal:     General: Bowel sounds are normal. There is no distension.     Palpations: Abdomen is soft. There is no mass.     Tenderness: There is no abdominal tenderness.  Musculoskeletal:        General: No tenderness. Normal range of motion.     Cervical back: Normal range of motion and neck supple.  Lymphadenopathy:     Cervical: No cervical adenopathy.  Skin:    General: Skin is warm and dry.     Findings: No erythema or rash.  Neurological:     Mental Status: He is alert.     Coordination: Coordination normal.  Psychiatric:        Behavior: Behavior normal.     ED Results / Procedures / Treatments    Labs (all labs ordered are listed, but only abnormal results are displayed) Labs Reviewed  BASIC METABOLIC PANEL  CBC  TROPONIN I (HIGH SENSITIVITY)  TROPONIN I (HIGH SENSITIVITY)    EKG EKG Interpretation  Date/Time:  Monday July 01 2019 13:43:16 EDT Ventricular Rate:  63 PR Interval:  142 QRS Duration: 98 QT Interval:  396 QTC Calculation: 405 R Axis:   61 Text Interpretation: Normal sinus rhythm Normal ECG Since last tracing rate slower Confirmed by Eber Hong ([id number]) on 07/01/2019 1:57:21 PM   Radiology DG Chest 2 View  Result Date: 07/01/2019 CLINICAL DATA:  Chest pain EXAM: CHEST - 2 VIEW COMPARISON:  None. FINDINGS: The heart size and mediastinal contours are within normal limits. Both lungs are clear. No pleural effusion or pneumothorax. The visualized skeletal structures are unremarkable. IMPRESSION: No acute process in the chest Electronically Signed   By: Guadlupe Spanish M.D.   On: 07/01/2019 14:30    Procedures Procedures (including critical care time)  Medications Ordered in ED Medications - No data to display  ED Course  I have reviewed the triage vital signs and the nursing notes.  Pertinent labs & imaging results that were available during my care of the patient were reviewed by me and considered in my medical decision making (see chart for details).  Clinical Course as of Jul 01 1551  Mon Jul 01, 2019  1552 Labs are totally normal, troponin is totally normal and the chest x-ray is unremarkable   [BM]  1552 This patient is stable for discharge, likely noncardiac chest pain   [BM]    Clinical Course User Index [BM] Eber Hong, MD   MDM Rules/Calculators/A&P                      This patient has some intermittent right-sided chest pain that seems to be related more to movement.  He does not have any significant cardiac risk factors other than tobacco use.  He has no significant family members that died of an early age of cardiac disease, I will  order a troponin and a chest x-ray, his EKG is very reassuring and totally normal.  The patient is agreeable to the plan  Final Clinical Impression(s) / ED Diagnoses Final diagnoses:  Right-sided chest pain    Rx / DC Orders ED Discharge Orders    None  Noemi Chapel, MD 07/01/19 [phone]

## 2019-07-01 NOTE — ED Triage Notes (Signed)
Pt presents to ED with complaints of right sided chest pain started this am. Pt states feels like indigestion but also hurts to lift arms. Pt denies SOB, dizziness, N/V

## 2019-11-17 ENCOUNTER — Other Ambulatory Visit: Payer: Self-pay

## 2019-11-17 ENCOUNTER — Emergency Department (HOSPITAL_COMMUNITY)
Admission: EM | Admit: 2019-11-17 | Discharge: 2019-11-17 | Disposition: A | Discharge status: Home / Self Care | Payer: Self-pay

## 2020-05-05 ENCOUNTER — Other Ambulatory Visit: Payer: Self-pay

## 2020-05-08 ENCOUNTER — Other Ambulatory Visit: Payer: Self-pay

## 2021-01-13 ENCOUNTER — Encounter (HOSPITAL_COMMUNITY): Payer: Self-pay | Admitting: Radiology

## 2021-01-13 ENCOUNTER — Other Ambulatory Visit: Payer: Self-pay

## 2021-01-13 ENCOUNTER — Emergency Department (HOSPITAL_COMMUNITY)
Admission: EM | Admit: 2021-01-13 | Discharge: 2021-01-13 | Disposition: A | Discharge status: Home / Self Care | Payer: Self-pay | Attending: Emergency Medicine | Admitting: Emergency Medicine

## 2021-01-13 ENCOUNTER — Emergency Department (HOSPITAL_COMMUNITY): Payer: Self-pay

## 2021-01-13 DIAGNOSIS — Z0000 Encounter for general adult medical examination without abnormal findings: Secondary | ICD-10-CM | POA: Insufficient documentation

## 2021-01-13 DIAGNOSIS — F17210 Nicotine dependence, cigarettes, uncomplicated: Secondary | ICD-10-CM | POA: Insufficient documentation

## 2021-01-13 NOTE — ED Provider Notes (Signed)
[facility] EMERGENCY DEPARTMENT Provider Note   CSN: [id number] Arrival date & time: 01/13/21  1651     History Chief Complaint  Patient presents with   knot on left rib    Christopher Key is a 45 y.o. male.  Patient reports noticing a "knot" on his left lowest rib today. Found incidentally. No pain. No known injury. Denies cough or shortness of breath. No fever or chills. No significant past medical history. No current medications.  The history is provided by the patient. No language interpreter was used.      Past Medical History:  Diagnosis Date   Hypercholesteremia     Patient Active Problem List   Diagnosis Date Noted   ALCOHOL USE 05/28/2008   SMOKER 05/28/2008   GERD 05/28/2008   CONSTIPATION, CHRONIC 05/28/2008   HEMATOCHEZIA 05/28/2008   HEARTBURN 05/28/2008    History reviewed. No pertinent surgical history.     History reviewed. No pertinent family history.  Social History   Tobacco Use   Smoking status: Every Day    Packs/day: 1.00    Types: Cigarettes   Smokeless tobacco: Never  Vaping Use   Vaping Use: Never used  Substance Use Topics   Alcohol use: Not Currently   Drug use: Never    Home Medications Prior to Admission medications   Not on File    Allergies    Patient has no known allergies.  Review of Systems   Review of Systems  Constitutional:  Negative for fever.  Respiratory:  Negative for cough.   Cardiovascular:  Negative for chest pain.  Skin:  Negative for rash.  All other systems reviewed and are negative.  Physical Exam Updated Vital Signs BP 131/78 (BP Location: Left Arm)   Pulse (!) 59   Temp (!) 97.2 F (36.2 C) (Oral)   Resp 16   Ht 6\' 1"  (1.854 m)   Wt 95.3 kg   SpO2 100%   BMI 27.71 kg/m   Physical Exam Constitutional:      Appearance: Normal appearance.  HENT:     Head: Normocephalic.     Nose: Nose normal.  Eyes:     Conjunctiva/sclera: Conjunctivae normal.  Cardiovascular:     Rate  and Rhythm: Normal rate and regular rhythm.  Pulmonary:     Effort: Pulmonary effort is normal.     Breath sounds: Normal breath sounds.  Chest:     Chest wall: No tenderness.  Abdominal:     Palpations: Abdomen is soft.  Musculoskeletal:        General: Normal range of motion.     Cervical back: Normal range of motion.  Skin:    General: Skin is warm and dry.  Neurological:     Mental Status: He is alert and oriented to person, place, and time.  Psychiatric:        Mood and Affect: Mood normal.        Behavior: Behavior normal.    ED Results / Procedures / Treatments   Labs (all labs ordered are listed, but only abnormal results are displayed) Labs Reviewed - No data to display  EKG None  Radiology No results found.  Procedures Procedures   Medications Ordered in ED Medications - No data to display  ED Course  I have reviewed the triage vital signs and the nursing notes.  Pertinent labs & imaging results that were available during my care of the patient were reviewed by me and considered in my medical  decision making (see chart for details).    MDM Rules/Calculators/A&P                           Patient without acute findings on exam or radiology studies. Results shared with patient. Patient safe for discharge at this time. Encourage establishment of primary care provider. Final Clinical Impression(s) / ED Diagnoses Final diagnoses:  Normal exam    Rx / DC Orders ED Discharge Orders     None        Felicie Morn, NP 01/13/21 2311    Pollyann Savoy, MD 01/15/21 [id number]

## 2021-01-13 NOTE — Discharge Instructions (Addendum)
Your chest xray does not reveal any abnormality in the left lower ribs. You do have an old rib fracture of the 8th rib that appears chronic. Please follow up with a primary care provider as discussed.

## 2023-03-18 IMAGING — DX DG RIBS W/ CHEST 3+V*L*
5 series · 5 of 5 positions shown · non-contrast
Comparison: None.

CLINICAL DATA: Feels a knot along the left anterior chest. Noted
today. No known injury. Painless.

EXAM:
LEFT RIBS AND CHEST - 3+ VIEW

[chest pa]
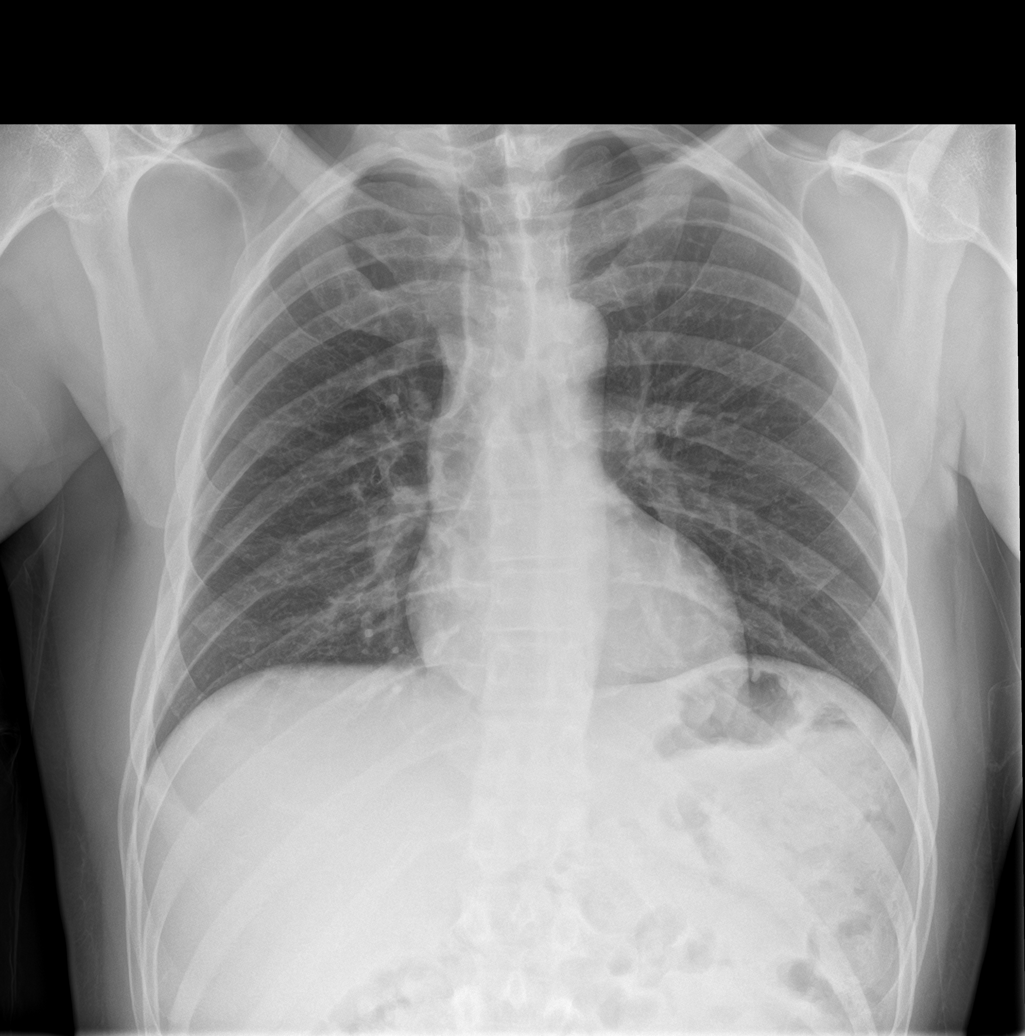

[rib pa obl (1 of 2)]
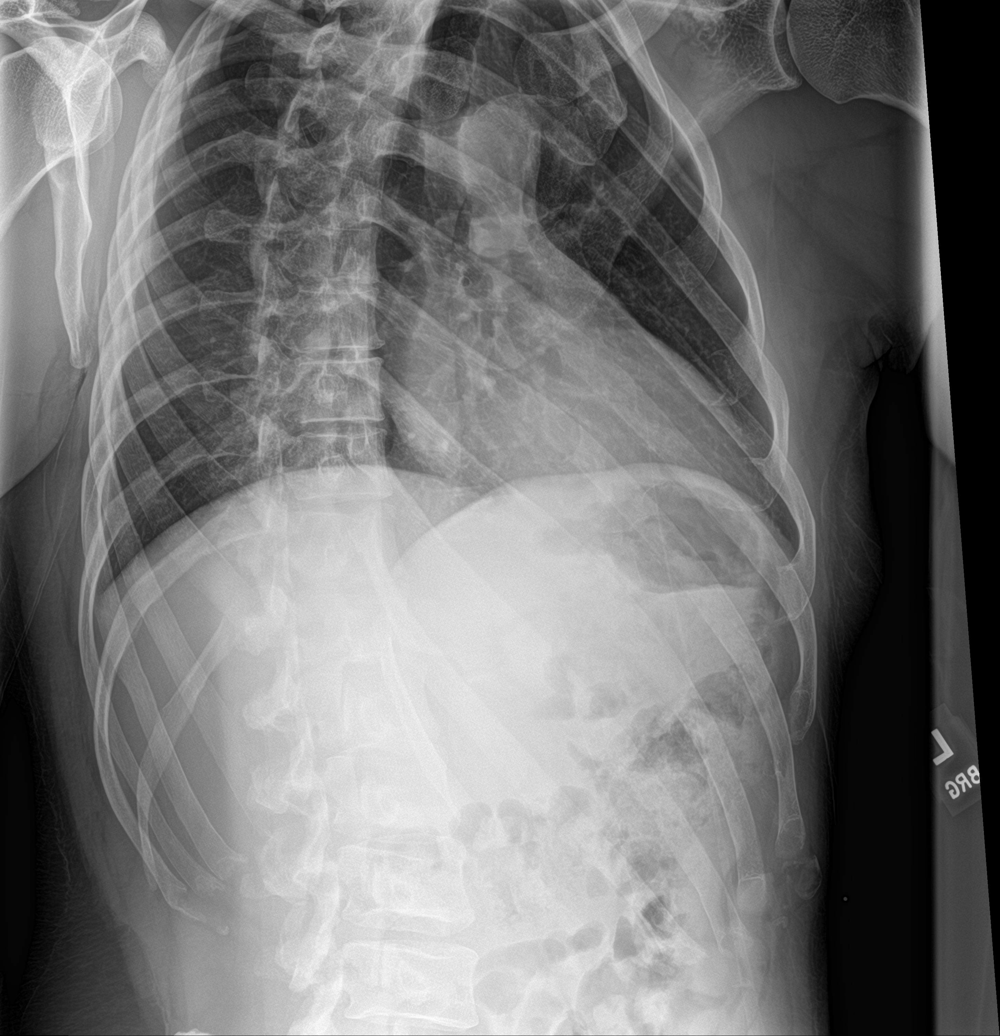

[rib pa obl (2 of 2)]
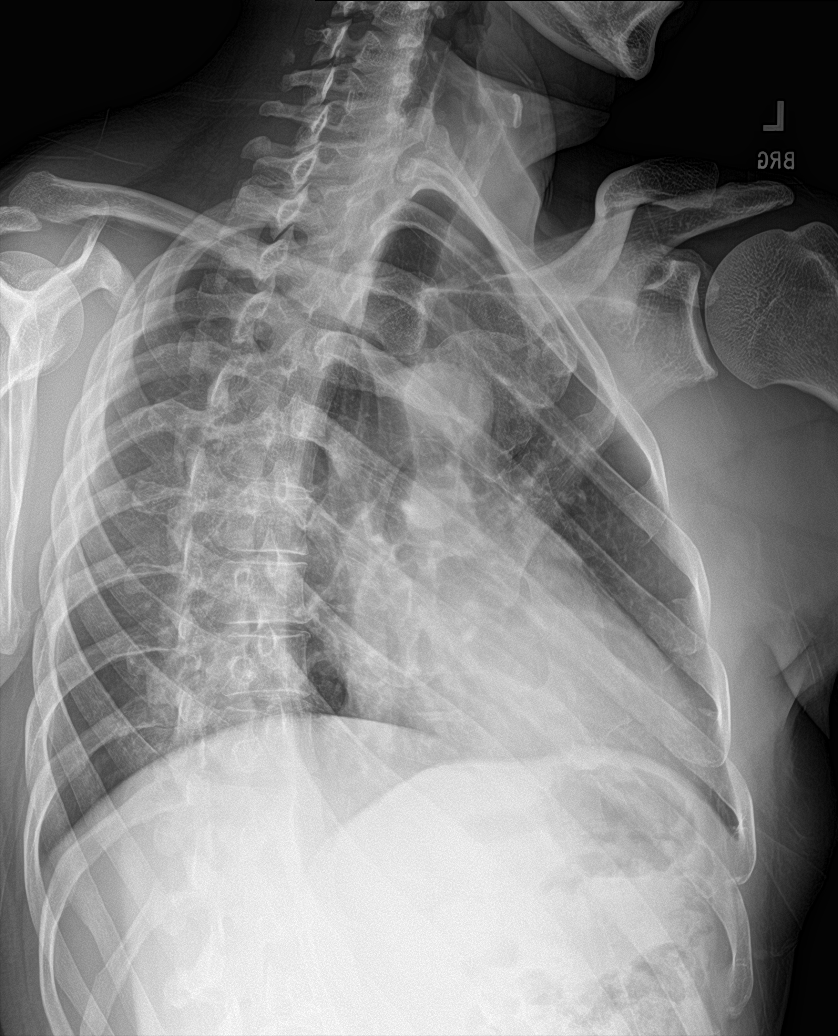

[rib pa (1 of 2)]
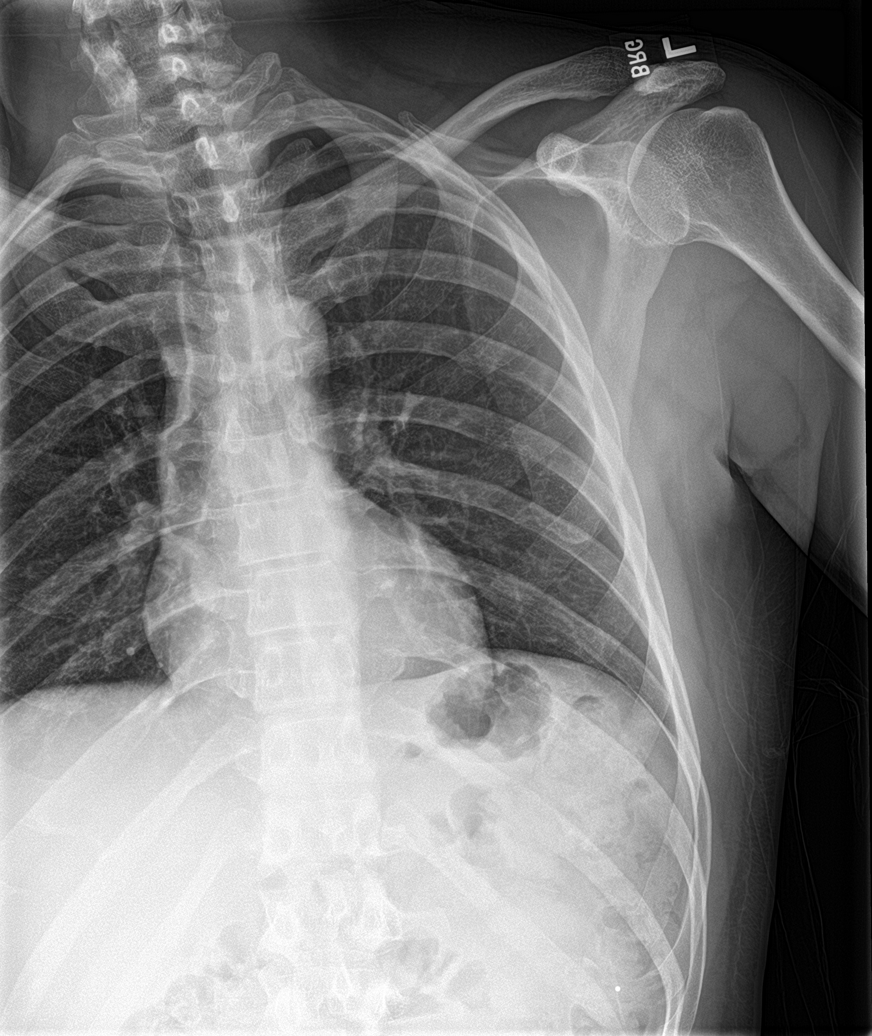

[rib pa (2 of 2)]
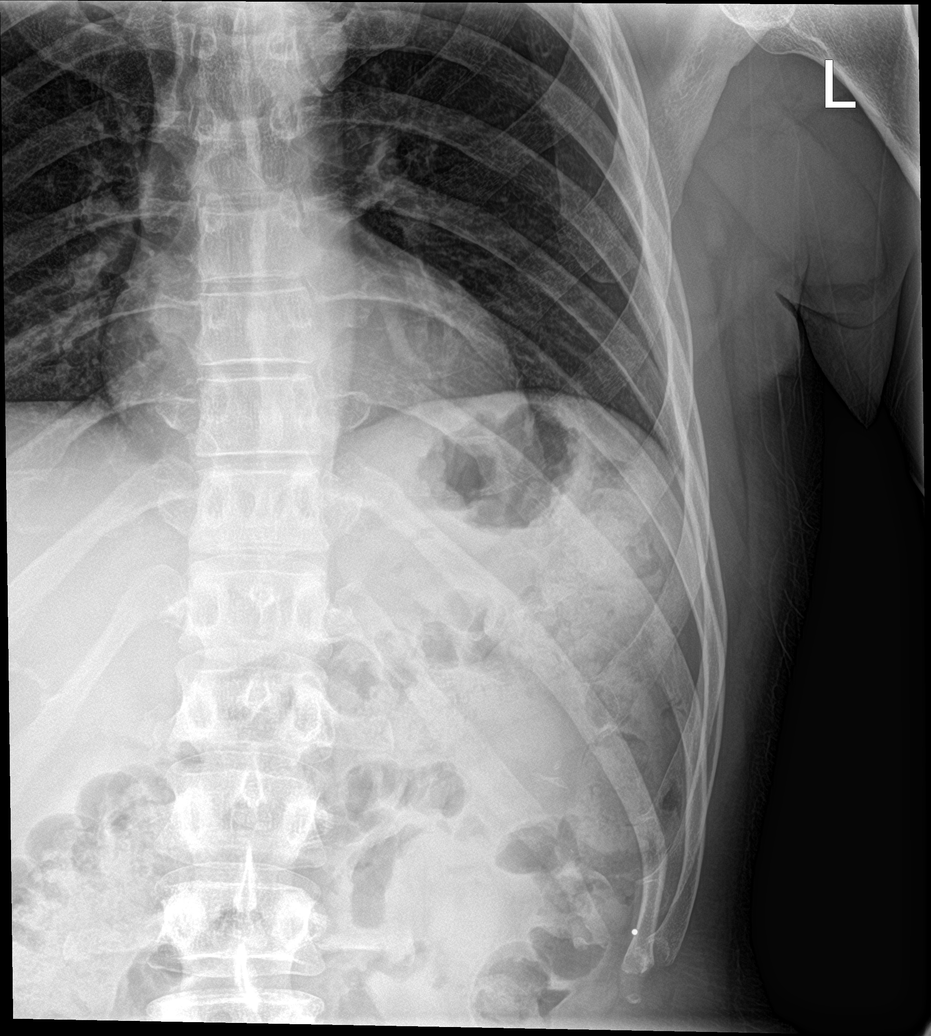

[5 of 5 positions shown; findings below may reference images not displayed]

FINDINGS: Subacute or chronic ununited fracture along the anterior medial
aspect of the left eighth rib adjacent to the costochondral
junction. There is no evidence of pneumothorax or pleural effusion.
Both lungs are clear. Heart size and mediastinal contours are within
normal limits.
IMPRESSION: Subacute or chronic ununited fracture along the anterior medial
aspect of the left eighth rib adjacent to the costochondral
junction.
